# Patient Record
Sex: Male | Born: 1935 | Race: White | Hispanic: No | Marital: Married | Smoking: Never smoker
Health system: Southern US, Community
[De-identification: ages and names within clinical notes are randomized; demographics above are authoritative.]

## PROBLEM LIST (undated history)

## (undated) DIAGNOSIS — I10 Essential (primary) hypertension: Secondary | ICD-10-CM

## (undated) DIAGNOSIS — E119 Type 2 diabetes mellitus without complications: Secondary | ICD-10-CM

## (undated) HISTORY — DX: Essential (primary) hypertension: I10

## (undated) HISTORY — DX: Type 2 diabetes mellitus without complications: E11.9

---

## 2008-10-29 ENCOUNTER — Inpatient Hospital Stay: Payer: Self-pay | Admitting: Internal Medicine

## 2009-03-18 ENCOUNTER — Inpatient Hospital Stay: Payer: Self-pay | Admitting: Internal Medicine

## 2009-04-21 ENCOUNTER — Encounter: Payer: Self-pay | Admitting: Cardiology

## 2009-05-02 ENCOUNTER — Encounter: Payer: Self-pay | Admitting: Cardiology

## 2009-06-01 ENCOUNTER — Encounter: Payer: Self-pay | Admitting: Cardiology

## 2010-11-30 ENCOUNTER — Inpatient Hospital Stay: Payer: Self-pay | Admitting: Cardiology

## 2011-01-20 IMAGING — CR DG CHEST 1V PORT
1 series · 1 of 1 positions shown · non-contrast
Comparison: none

REASON FOR EXAM: Chest Pain
COMMENTS:

PROCEDURE:     DXR - DXR PORTABLE CHEST SINGLE VIEW  - March 18, 2009  [DATE]
RESULT:     Comparison: 10/12/2007

[view not recorded]
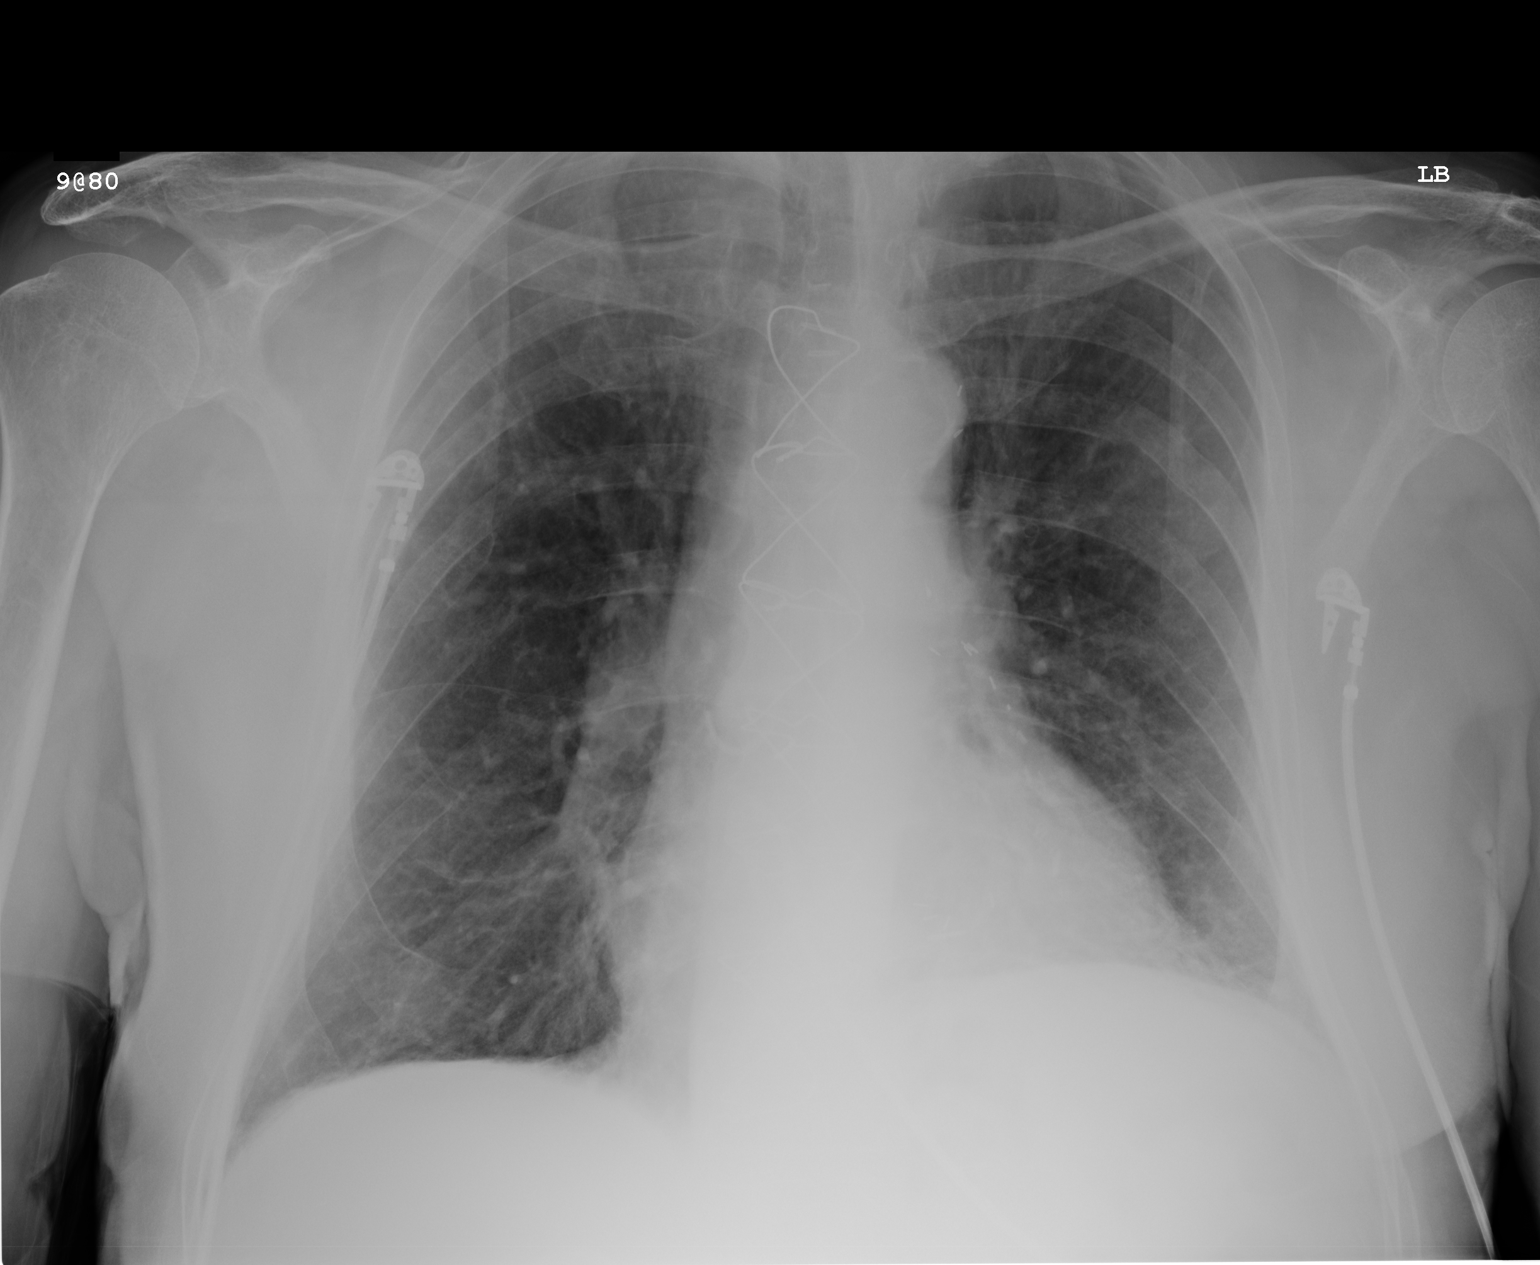

[1 of 1 positions shown; findings below may reference images not displayed]

FINDINGS: Single portable AP chest radiograph is provided. There is no focal
parenchymal opacity, pleural effusion, or pneumothorax. Normal
cardiomediastinal silhouette. There is evidence of prior CABG. The osseous
structures are unremarkable.
IMPRESSION: No acute disease of the chest.

## 2011-01-20 IMAGING — CT CT HEAD WITHOUT CONTRAST
1 series · 16 of 30 positions shown, 20 images · non-contrast
Comparison: none

REASON FOR EXAM: recent TIA with slurred speech
COMMENTS:

PROCEDURE:     CT  - CT HEAD WITHOUT CONTRAST  - March 18, 2009  [DATE]
RESULT:     Comparison:  None
TECHNIQUE: Multiple axial images from the foramen magnum to the vertex were
obtained without IV contrast.

[Series 2: soft tissue · axial · 0.45mm/px · z∈[-149,-14]mm · 16 of 30 slices shown, 20 images]
[im 2/30  brain]
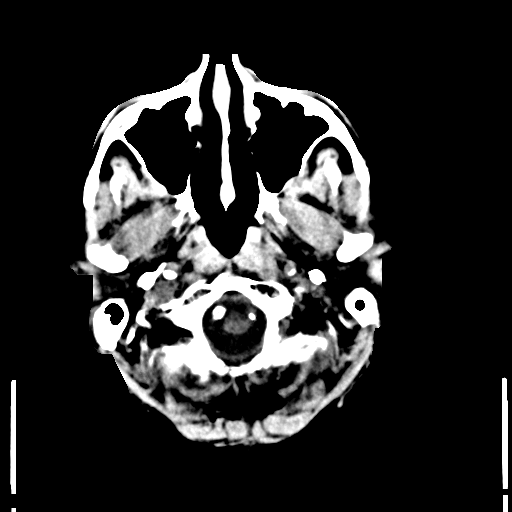
[im 2/30  bone]
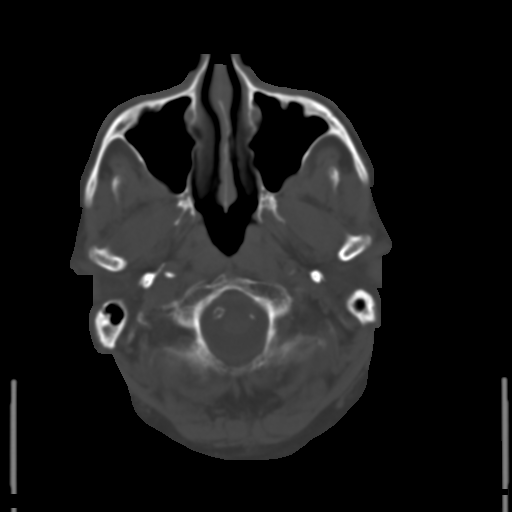
[im 4/30  brain]
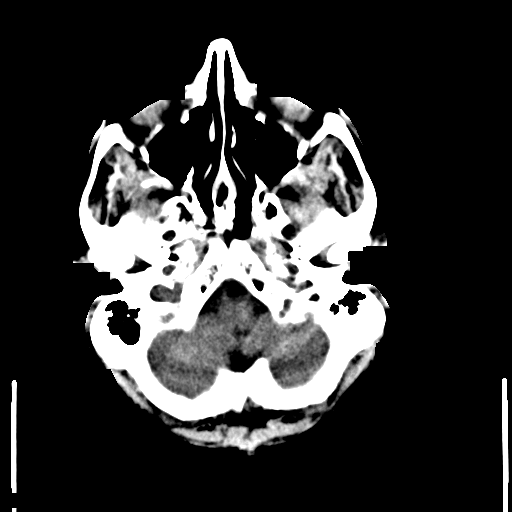
[im 6/30  brain]
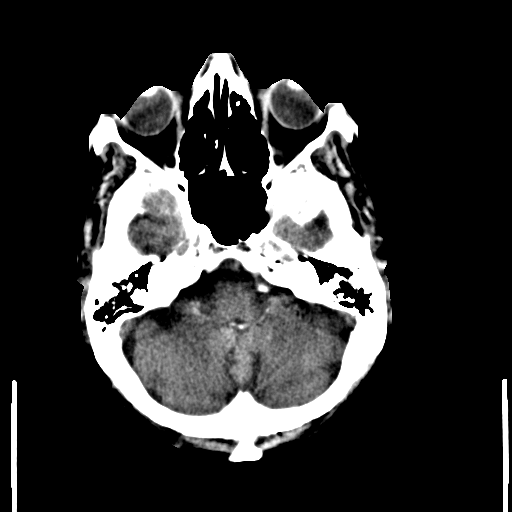
[im 8/30  brain]
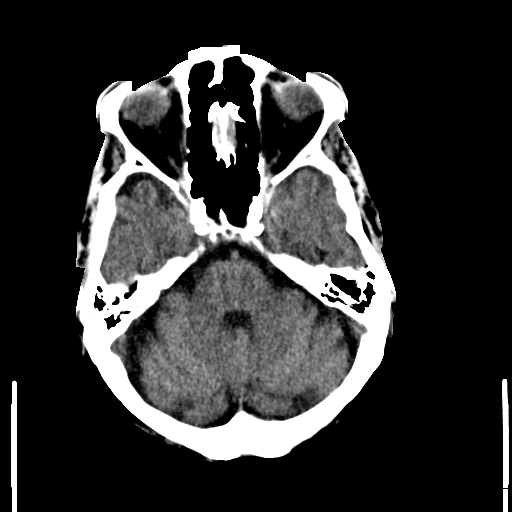
[im 9/30  brain]
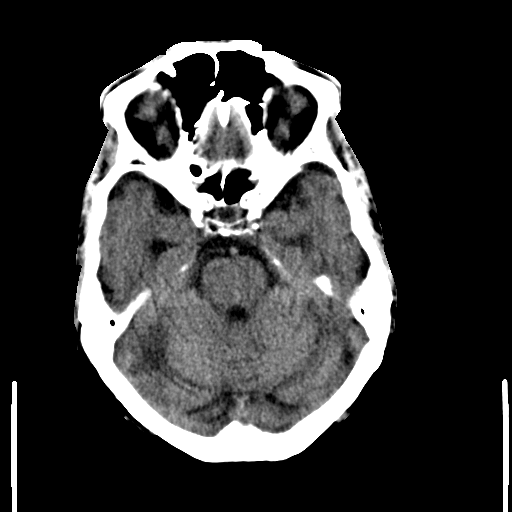
[im 9/30  bone]
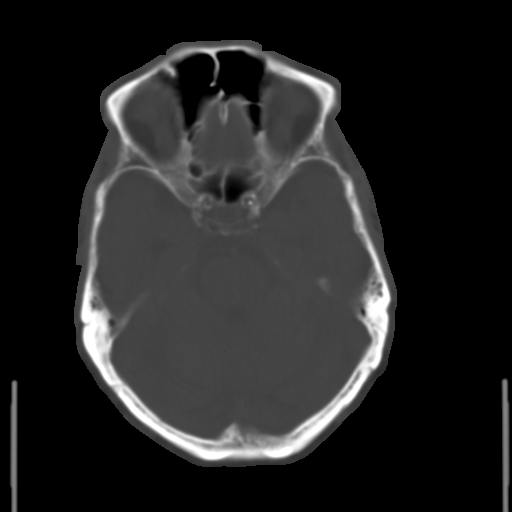
[im 11/30  brain]
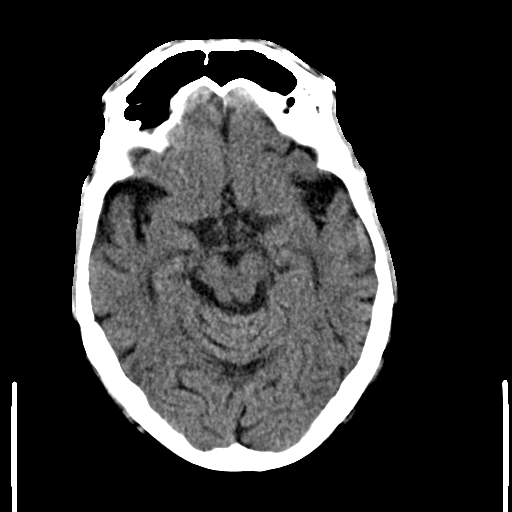
[im 13/30  brain]
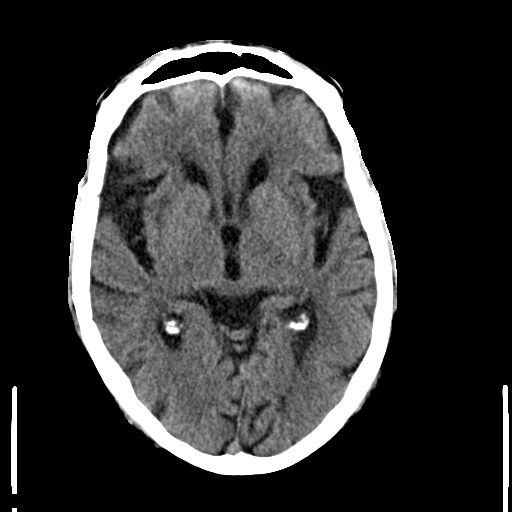
[im 15/30  brain]
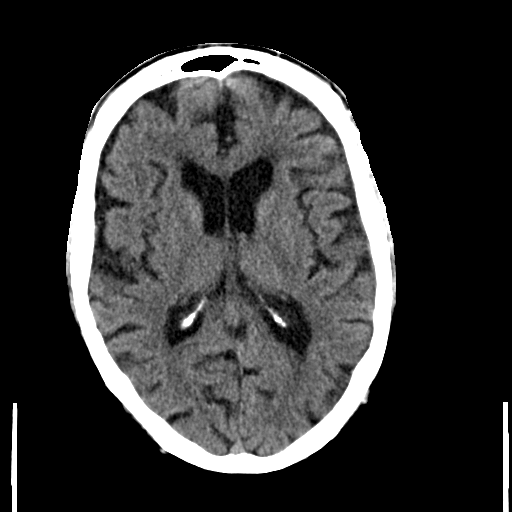
[im 16/30  brain]
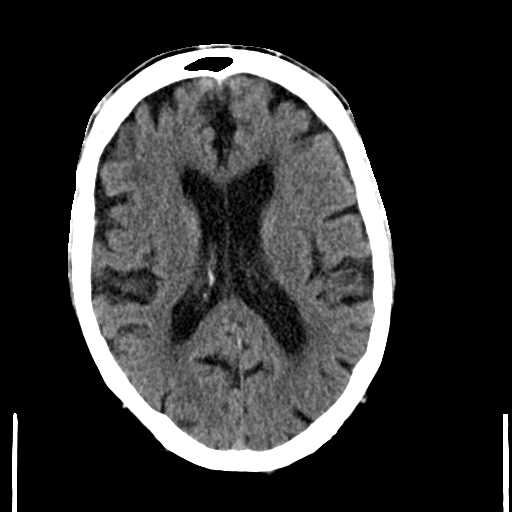
[im 16/30  bone]
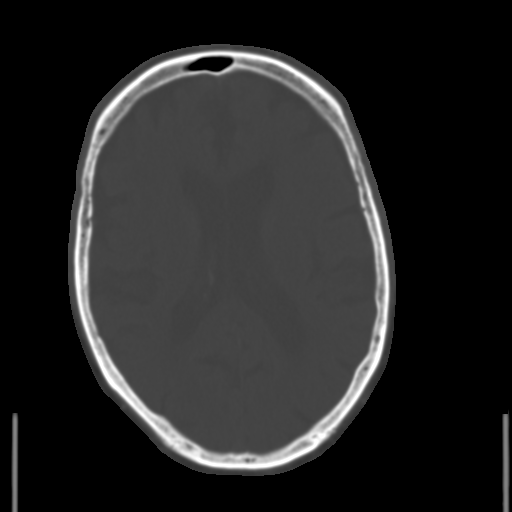
[im 18/30  brain]
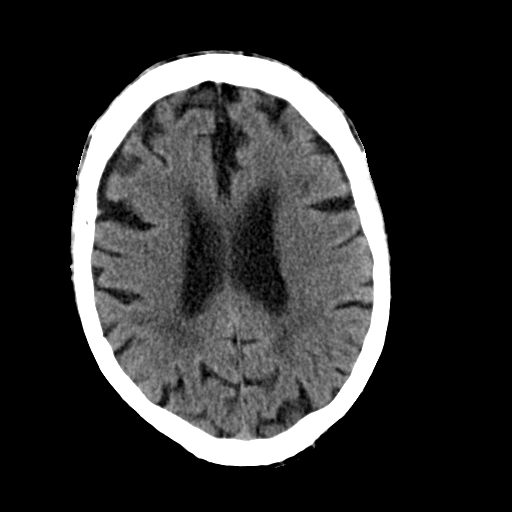
[im 20/30  brain]
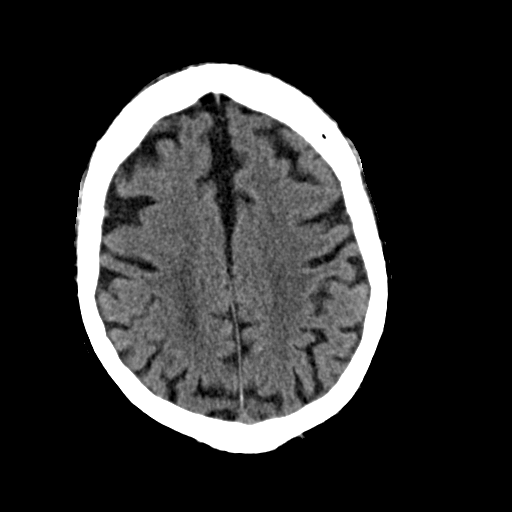
[im 22/30  brain]
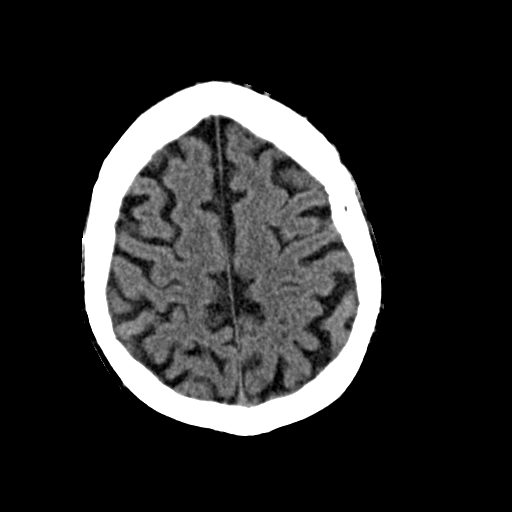
[im 23/30  brain]
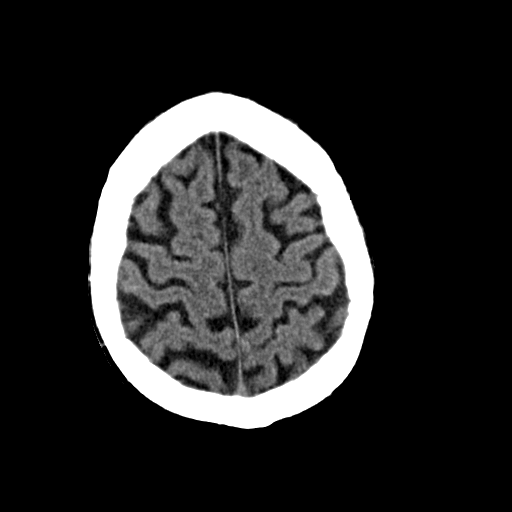
[im 23/30  bone]
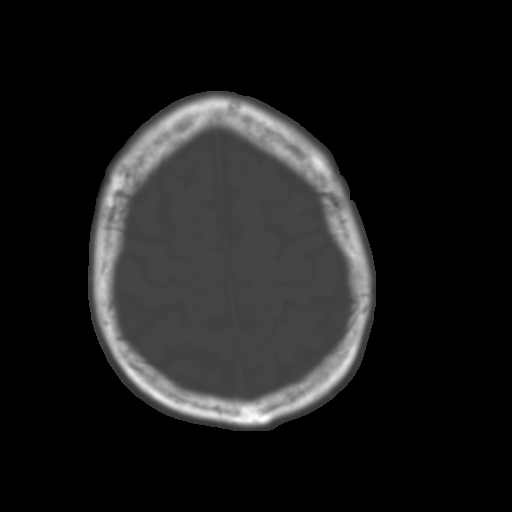
[im 25/30  brain]
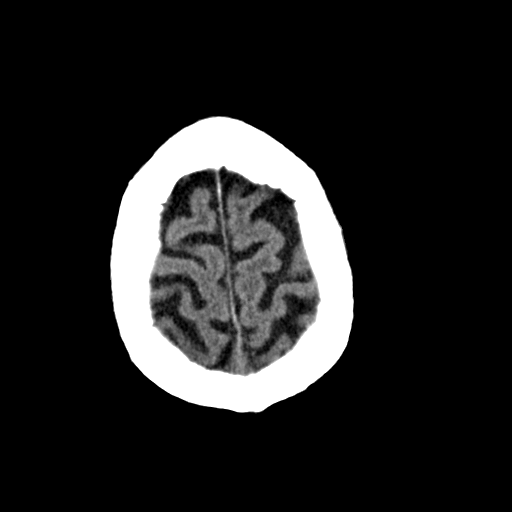
[im 27/30  brain]
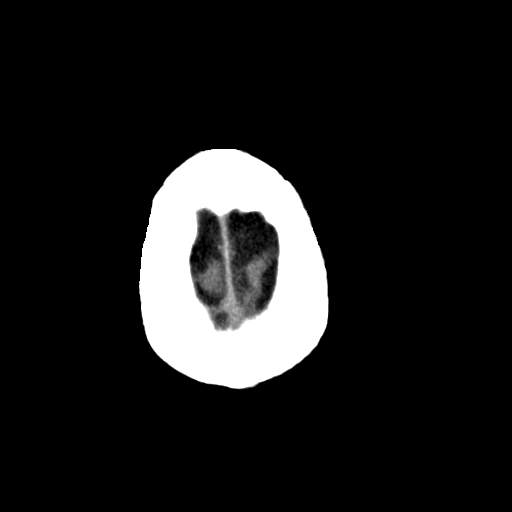
[im 29/30  brain]
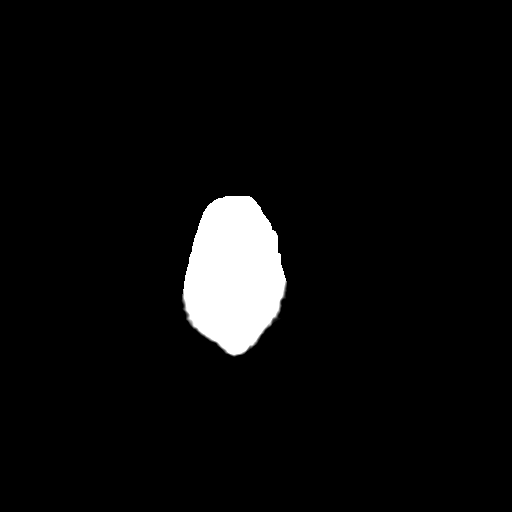

[16 of 30 positions shown; findings below may reference images not displayed]

FINDINGS: There is no evidence of mass effect, midline shift, or extra-axial fluid
collections.  There is no evidence of a space-occupying lesion or
intracranial hemorrhage. There is no evidence of a cortical-based area of
acute infarction. There is generalized cerebral atrophy. There is
periventricular white matter low attenuation likely secondary to
microangiopathy.

The ventricles and sulci are appropriate for the patient's age. The basal
cisterns are patent.

Visualized portions of the orbits are unremarkable. The visualized portions
of the paranasal sinuses and mastoid air cells are unremarkable.
Cerebrovascular atherosclerotic calcifications are noted.

The osseous structures are unremarkable.
IMPRESSION: No acute intracranial process.

## 2013-01-23 ENCOUNTER — Ambulatory Visit (INDEPENDENT_AMBULATORY_CARE_PROVIDER_SITE_OTHER): Payer: Medicare Other | Admitting: Podiatry

## 2013-01-23 ENCOUNTER — Encounter: Payer: Self-pay | Admitting: Podiatry

## 2013-01-23 VITALS — BP 107/60 | HR 54 | Resp 16 | Ht 70.0 in | Wt 165.0 lb

## 2013-01-23 DIAGNOSIS — M79609 Pain in unspecified limb: Secondary | ICD-10-CM

## 2013-01-23 DIAGNOSIS — B351 Tinea unguium: Secondary | ICD-10-CM

## 2013-01-23 MED ORDER — GABAPENTIN 300 MG PO CAPS: 300.0000 mg | ORAL_CAPSULE | Freq: Two times a day (BID) | ORAL | Status: DC

## 2013-01-23 NOTE — Progress Notes (Signed)
Subjective:     Patient ID: Mark Jones, male   DOB: October 07, 1921, 79 y.o.   MRN: [id number]  HPI patient presents with E. long gaited thick nails 1-5 both feet and diabetic shoes which are several years old   Review of Systems     Objective:   Physical Exam Neurovascular unchanged from previous visit with thick painful nail bed 1-5 both feet    Assessment:     Mycotic nail infection with pain 1-5 both feet    Plan:     Debrided painful nailbeds 1-5 both feet with no iatrogenic bleeding noted

## 2013-01-23 NOTE — Progress Notes (Signed)
   Subjective:    Patient ID: Mark Jones, male    DOB: [birth year], 79 y.o.   MRN: [id number]  HPI Comments: Toenails and need a new prescription      Review of Systems     Objective:   Physical Exam        Assessment & Plan:

## 2013-02-14 ENCOUNTER — Telehealth: Payer: Self-pay | Admitting: *Deleted

## 2013-02-14 MED ORDER — GABAPENTIN 300 MG PO CAPS: 300.0000 mg | ORAL_CAPSULE | Freq: Two times a day (BID) | ORAL | Status: AC

## 2013-02-14 NOTE — Telephone Encounter (Signed)
[facility] SENT OVER FAX REFILL REQUEST GABAPENTIN 300 MG #180 WITH 5 REFILLS.

## 2013-04-24 ENCOUNTER — Encounter: Payer: Self-pay | Admitting: Podiatry

## 2013-04-24 ENCOUNTER — Ambulatory Visit (INDEPENDENT_AMBULATORY_CARE_PROVIDER_SITE_OTHER): Payer: Medicare Other | Admitting: Podiatry

## 2013-04-24 VITALS — BP 160/72 | HR 64 | Resp 12

## 2013-04-24 DIAGNOSIS — M79609 Pain in unspecified limb: Secondary | ICD-10-CM

## 2013-04-24 DIAGNOSIS — B351 Tinea unguium: Secondary | ICD-10-CM

## 2013-04-25 NOTE — Progress Notes (Signed)
Subjective:     Patient ID: Mark Jones, male   DOB: 12/06/1921, 80 y.o.   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

## 2013-07-23 ENCOUNTER — Inpatient Hospital Stay: Payer: Self-pay | Admitting: Internal Medicine

## 2013-07-24 ENCOUNTER — Ambulatory Visit: Payer: Medicare Other | Admitting: Podiatry

## 2013-07-24 DIAGNOSIS — R079 Chest pain, unspecified: Secondary | ICD-10-CM

## 2013-10-09 ENCOUNTER — Encounter: Payer: Self-pay | Admitting: Podiatry

## 2013-10-09 ENCOUNTER — Ambulatory Visit (INDEPENDENT_AMBULATORY_CARE_PROVIDER_SITE_OTHER): Payer: Medicare Other | Admitting: Podiatry

## 2013-10-09 VITALS — BP 117/47 | HR 76 | Resp 12

## 2013-10-09 DIAGNOSIS — B351 Tinea unguium: Secondary | ICD-10-CM

## 2013-10-09 DIAGNOSIS — M79673 Pain in unspecified foot: Secondary | ICD-10-CM

## 2013-10-09 DIAGNOSIS — M79609 Pain in unspecified limb: Secondary | ICD-10-CM

## 2013-10-11 NOTE — Progress Notes (Signed)
Subjective:  °  ° Patient ID: Mark Jones, male   DOB: 09/23/1921, 80 y.o.   MRN: [id number] ° °HPI patient presents stating I need to have my nails taken care of as they are long thick painful and I cannot cut them ° ° °Review of Systems ° °   °Objective:  ° Physical Exam °Neurovascular status intact with muscle strength adequate and nail disease with pain 1-5 both feet that are brittle yellow in appearance °   °Assessment:  °   °Mycotic nail infection is with pain 1-5 both feet °   °Plan:  °   °Debride painful nailbeds 1-5 both feet with no iatrogenic bleeding noted °   ° ° °

## 2013-10-19 ENCOUNTER — Other Ambulatory Visit: Payer: Medicare Other

## 2014-01-11 ENCOUNTER — Ambulatory Visit (INDEPENDENT_AMBULATORY_CARE_PROVIDER_SITE_OTHER): Payer: Medicare Other | Admitting: Podiatry

## 2014-01-11 DIAGNOSIS — M79673 Pain in unspecified foot: Secondary | ICD-10-CM

## 2014-01-11 DIAGNOSIS — B351 Tinea unguium: Secondary | ICD-10-CM | POA: Diagnosis not present

## 2014-01-11 NOTE — Progress Notes (Signed)
Subjective:     Patient ID: Mark Jones, male   DOB: 1921-12-15, 80 y.o.   MRN: [id number]  HPI patient presents stating I need to have my nails taken care of as they are long thick painful and I cannot cut them   Review of Systems     Objective:   Physical Exam Neurovascular status intact with muscle strength adequate and nail disease with pain 1-5 both feet that are brittle yellow in appearance    Assessment:     Mycotic nail infection is with pain 1-5 both feet    Plan:     Debride painful nailbeds 1-5 both feet with no iatrogenic bleeding noted

## 2014-03-20 ENCOUNTER — Other Ambulatory Visit: Payer: Self-pay

## 2014-04-02 ENCOUNTER — Ambulatory Visit
Admit: 2014-04-02 | Disposition: A | Discharge status: Home / Self Care | Payer: Self-pay | Attending: Internal Medicine | Admitting: Internal Medicine

## 2014-04-02 DEATH — deceased

## 2014-04-12 ENCOUNTER — Ambulatory Visit: Payer: Medicare Other | Admitting: Podiatry

## 2016-01-22 IMAGING — CR RIGHT FOOT COMPLETE - 3+ VIEW
1 series · 3 of 3 positions shown · non-contrast
Comparison: None.

CLINICAL DATA: Right fourth toe ulcer, open wound

EXAM:
RIGHT FOOT COMPLETE - 3+ VIEW

[Series 1: ap · 0.17mm/px · 3 of 3 slices shown]
[im 1/3]
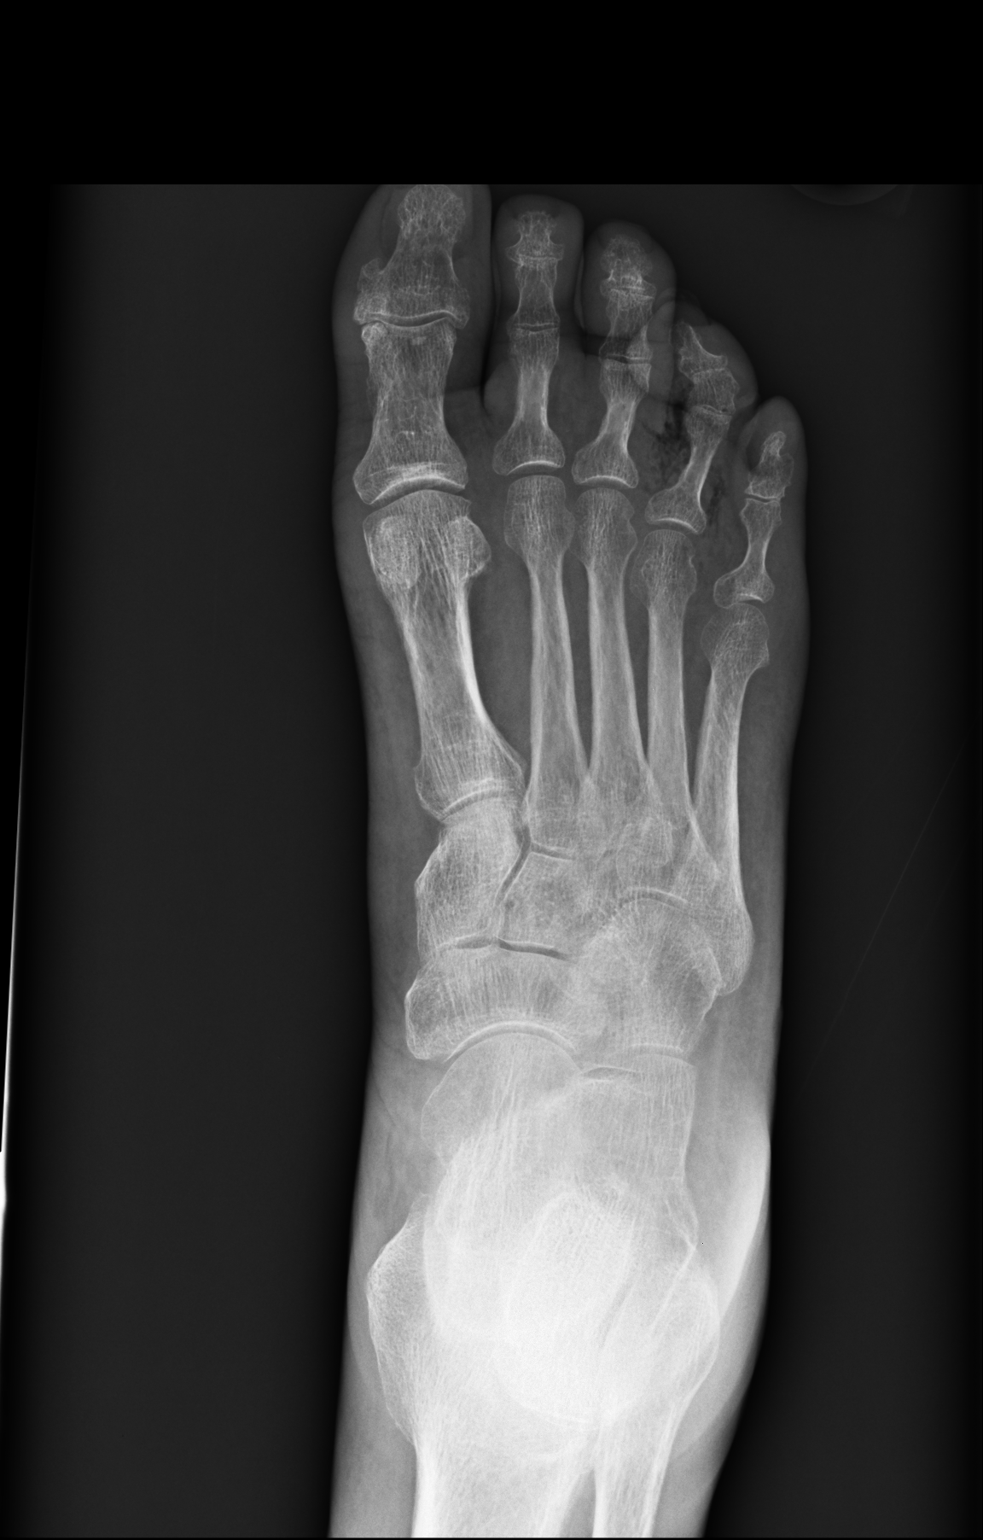
[im 2/3]
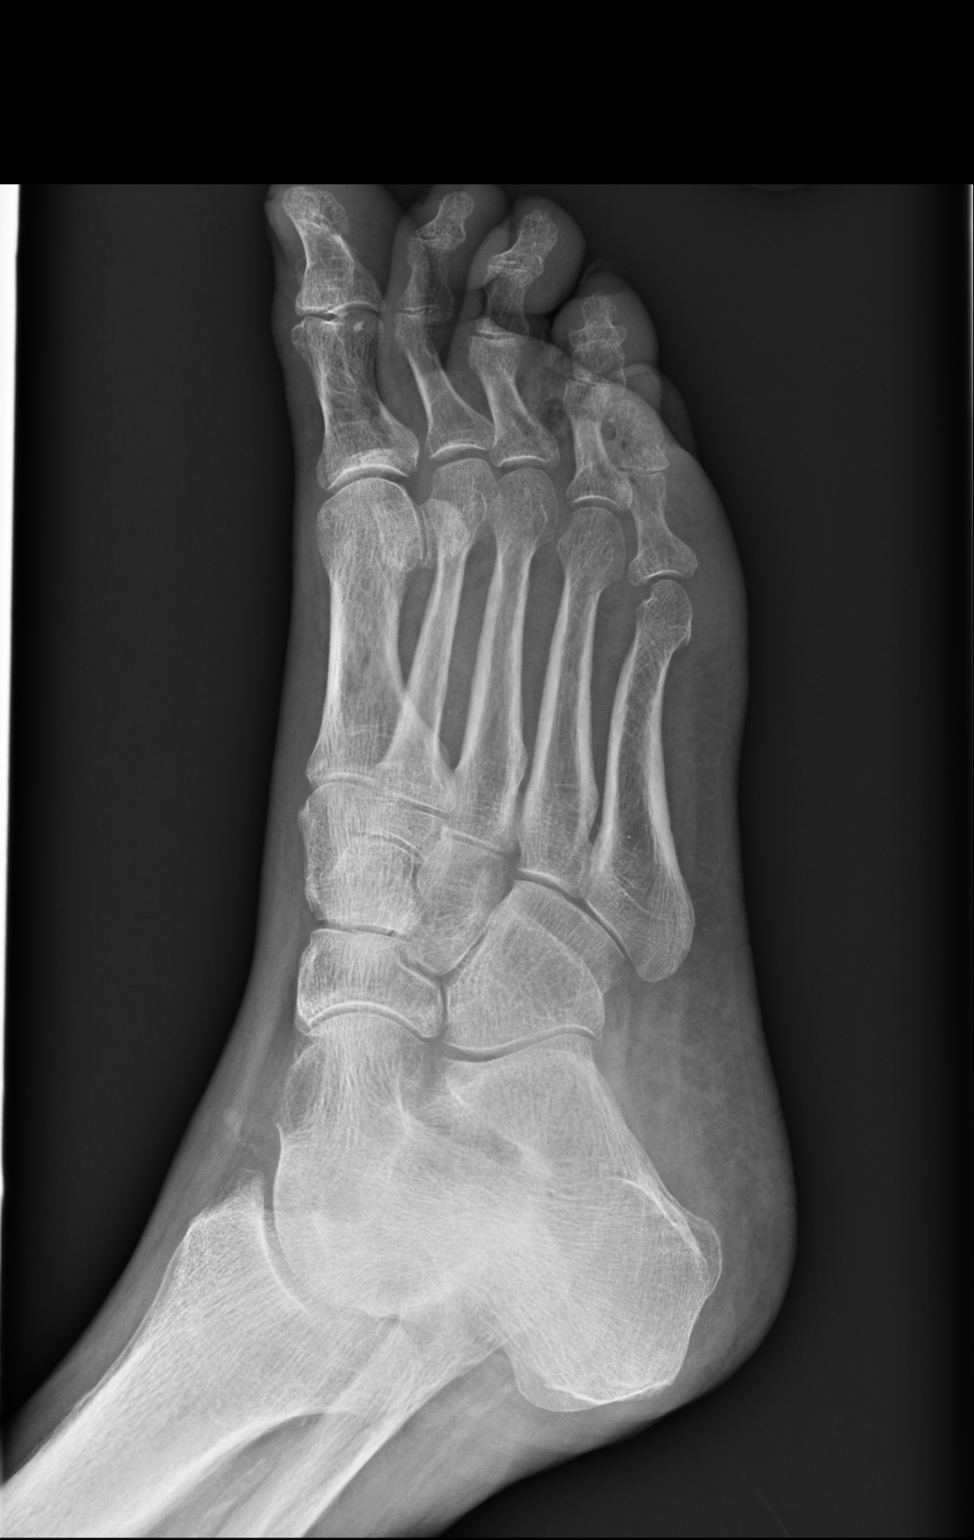
[im 3/3]
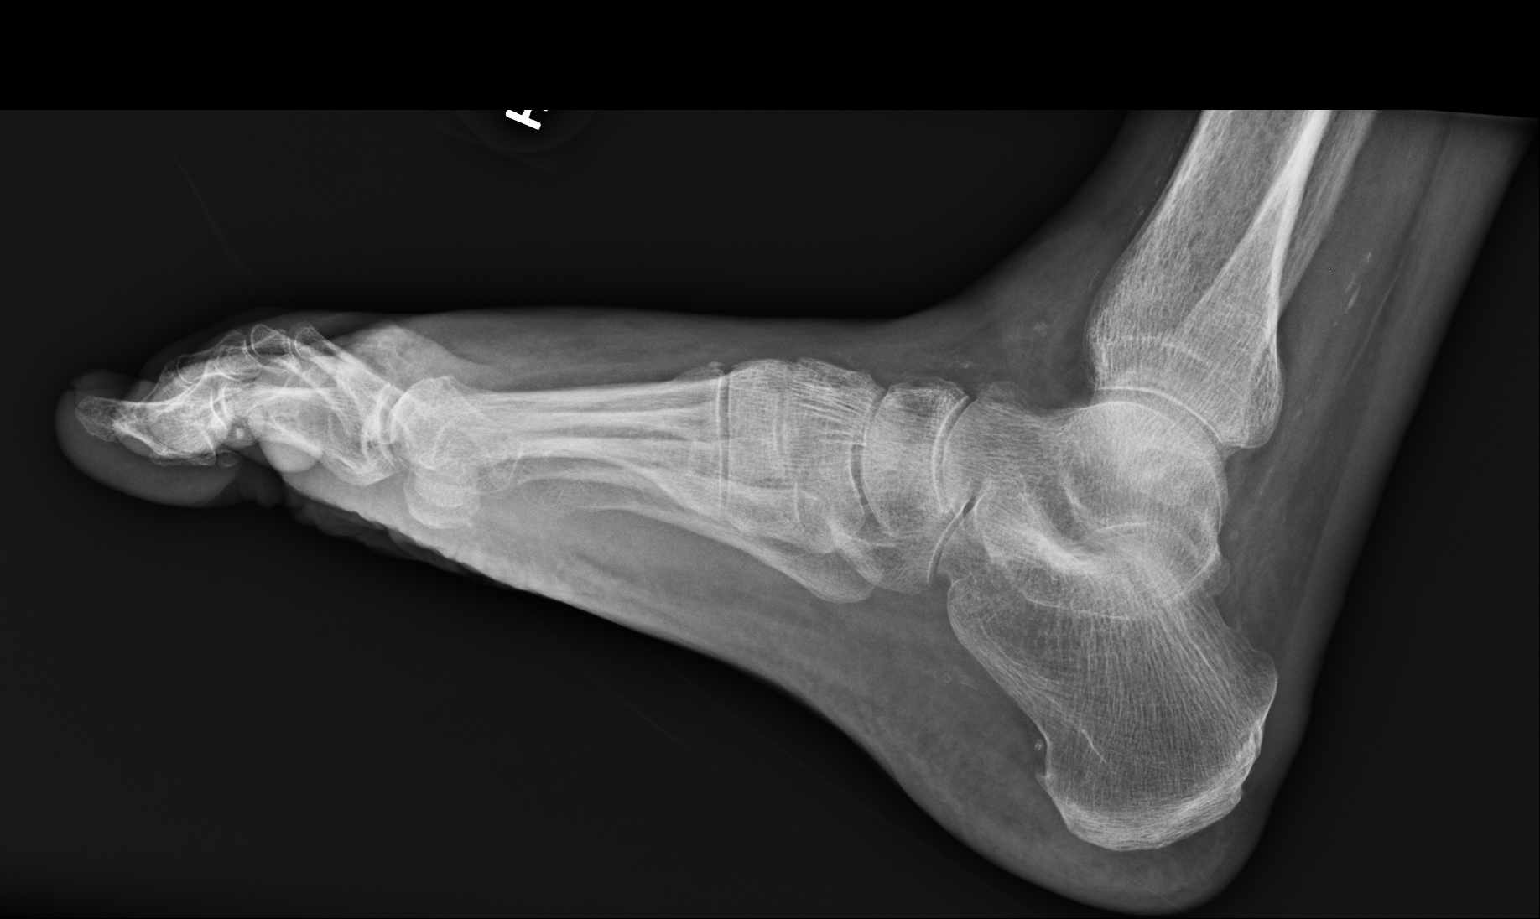

[3 of 3 positions shown; findings below may reference images not displayed]

FINDINGS: Three views of the right foot submitted. No acute fracture or
subluxation. There is diffuse osteopenia. Significant soft tissue
swelling noted fourth toe. There is patchy soft tissue air at the
base of fourth toe probable due to cellulitis or soft tissue open
wound. No definite bone destruction or bony erosion to suggest
osteomyelitis.
IMPRESSION: No acute fracture or subluxation. There is diffuse osteopenia.
Significant soft tissue swelling noted fourth toe. There is patchy
soft tissue air at the base of fourth toe probable due to cellulitis
or soft tissue open wound. No definite bone destruction or bony
erosion to suggest osteomyelitis.
